# Patient Record
Sex: Female | Born: 2000
Health system: Southern US, Community
[De-identification: ages and names within clinical notes are randomized; demographics above are authoritative.]

## PROBLEM LIST (undated history)

## (undated) DIAGNOSIS — E119 Type 2 diabetes mellitus without complications: Secondary | ICD-10-CM

---
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
## 2019-01-02 ENCOUNTER — Ambulatory Visit (HOSPITAL_COMMUNITY)
Admission: RE | Admit: 2019-01-02 | Discharge: 2019-01-02 | Disposition: A | Discharge status: Home / Self Care | Payer: BC Managed Care – PPO | Source: Ambulatory Visit | Attending: Family Medicine | Admitting: Family Medicine

## 2019-01-02 ENCOUNTER — Other Ambulatory Visit: Payer: Self-pay

## 2019-01-02 ENCOUNTER — Other Ambulatory Visit (HOSPITAL_COMMUNITY): Payer: Self-pay | Admitting: Family Medicine

## 2019-01-02 DIAGNOSIS — N939 Abnormal uterine and vaginal bleeding, unspecified: Secondary | ICD-10-CM | POA: Diagnosis not present

## 2019-01-02 DIAGNOSIS — O039 Complete or unspecified spontaneous abortion without complication: Secondary | ICD-10-CM | POA: Diagnosis present

## 2019-01-27 ENCOUNTER — Other Ambulatory Visit: Payer: Self-pay

## 2019-01-27 ENCOUNTER — Emergency Department (HOSPITAL_COMMUNITY)
Admission: EM | Admit: 2019-01-27 | Discharge: 2019-01-27 | Disposition: A | Discharge status: Home / Self Care | Payer: BC Managed Care – PPO | Attending: Emergency Medicine | Admitting: Emergency Medicine

## 2019-01-27 ENCOUNTER — Encounter (HOSPITAL_COMMUNITY): Payer: Self-pay | Admitting: *Deleted

## 2019-01-27 DIAGNOSIS — Z7984 Long term (current) use of oral hypoglycemic drugs: Secondary | ICD-10-CM | POA: Diagnosis not present

## 2019-01-27 DIAGNOSIS — R102 Pelvic and perineal pain: Secondary | ICD-10-CM | POA: Diagnosis present

## 2019-01-27 DIAGNOSIS — E119 Type 2 diabetes mellitus without complications: Secondary | ICD-10-CM | POA: Insufficient documentation

## 2019-01-27 DIAGNOSIS — N939 Abnormal uterine and vaginal bleeding, unspecified: Secondary | ICD-10-CM | POA: Insufficient documentation

## 2019-01-27 DIAGNOSIS — Z79899 Other long term (current) drug therapy: Secondary | ICD-10-CM | POA: Insufficient documentation

## 2019-01-27 HISTORY — DX: Type 2 diabetes mellitus without complications: E11.9

## 2019-01-27 LAB — WET PREP, GENITAL
Clue Cells Wet Prep HPF POC: NONE SEEN
Sperm: NONE SEEN
Trich, Wet Prep: NONE SEEN
Yeast Wet Prep HPF POC: NONE SEEN

## 2019-01-27 LAB — URINALYSIS, ROUTINE W REFLEX MICROSCOPIC
Bilirubin Urine: NEGATIVE
Glucose, UA: 150 mg/dL — AB
Ketones, ur: NEGATIVE mg/dL
Leukocytes,Ua: NEGATIVE
Nitrite: NEGATIVE
Protein, ur: NEGATIVE mg/dL
Specific Gravity, Urine: 1.012 (ref 1.005–1.030)
pH: 6 (ref 5.0–8.0)

## 2019-01-27 LAB — I-STAT BETA HCG BLOOD, ED (MC, WL, AP ONLY): I-stat hCG, quantitative: <5 m[IU]/mL

## 2019-01-27 NOTE — Discharge Instructions (Addendum)
You were seen in the ER for irregular vaginal bleeding and spotting.  Pregnancy test is negative.  Urine analysis does not show signs of infection.  Pelvic exam does not show any anatomy abnormalities.  I suspect some of your irregular bleeding is from withdrawal bleeding from hormones normalizing after your recent miscarriage.  Monitor your bleeding quantity and duration.  Given your previous irregular bleeding and periods in the past, if this continues you may need formal work-up by OB/GYN.  Call them to make an appointment for further discussion if your vaginal spotting continues or does not improve.  Return to the ER for heavier hemorrhaging or clots, severe pelvic or lower abdominal pain, fevers, chills, lightheadedness, passing out, chest pain or shortness of breath

## 2019-01-27 NOTE — ED Provider Notes (Signed)
[facility] EMERGENCY DEPARTMENT Provider Note   CSN: [id number] Arrival date & time: 01/27/19  1520     History   Chief Complaint Chief Complaint  Patient presents with  . Vaginal Bleeding    HPI Linda Rowe is a 18 y.o. female with h/o DM on oral agents, recent spontaneous miscarriage on 12/22/2018 at approx 8-9 weeks presents for evaluation of vaginal spotting for the since 9/13.  Initially brown then bright red then pink streaks.  This stopped last night but came back this morning as pink tinged spotting. She is only needing to use panty liners. She has used 2 panty liners in the lat 24 hours.  No interventions. No modifying factors.  Associated with intermittent mild suprapubic cramping.  Occasional mild dysuria.  Today while in the shower she inserted 2 fingers into her vagina and felt a "firmness" that she had never felt before and thinks her cervix is low.  This was not painful.  She uses vaginal ring for birth control but states the last 2 weeks she has not had it.  She has been sexually active with her female boyfriend for the last 2 weeks without birth control. They are not planning a pregnancy.  She has h/o irregular periods and spotting between periods several years ago but not lately.  States last month she went to her PCP who did a urine pregnancy test and it was negative. They then did an ultrasound and confirmed pregnancy.  She took one urine home pregnancy test today that was negative. She would like a blood pregnancy test. No known bleeding or clotting disorders. No anticoagulants. No known endometriosis, fibroids, ovarian cysts in the past.  She wanted boyfriend to stay in room during encounter. She is not concerned about STDs. Denies vaginal discharge or lesions.       HPI  Past Medical History:  Diagnosis Date  . Diabetes mellitus without complication (HCC)     There are no active problems to display for this patient.   History reviewed. No pertinent surgical  history.   OB History   No obstetric history on file.      Home Medications    Prior to Admission medications   Medication Sig Start Date End Date Taking? Authorizing Provider  ELURYNG 0.12-0.015 MG/24HR vaginal ring Place 1 each vaginally every 28 (twenty-eight) days.  01/22/19  Yes [provider]  glimepiride (AMARYL) 2 MG tablet Take 2 mg by mouth daily with breakfast.  01/12/19  Yes [provider]  metFORMIN (GLUCOPHAGE) 500 MG tablet Take 500 mg by mouth 2 (two) times daily with a meal.  01/12/19  Yes [provider]    Family History No family history on file.  Social History Social History   Tobacco Use  . Smoking status: Never Smoker  . Smokeless tobacco: Never Used  Substance Use Topics  . Alcohol use: Never    Frequency: Never  . Drug use: Never     Allergies   Patient has no known allergies.   Review of Systems Review of Systems  Genitourinary: Positive for pelvic pain (cramping) and vaginal bleeding.  All other systems reviewed and are negative.    Physical Exam Updated Vital Signs BP 119/75 (BP Location: Right Arm)   Pulse 89   Temp 98.2 F (36.8 C) (Oral)   Resp 18   Ht 5\' 4"  (1.626 m)   Wt 98 kg   LMP 01/24/2019   SpO2 100%   BMI 37.09 kg/m  Physical Exam Vitals signs and nursing note reviewed.  Constitutional:      Appearance: She is well-developed.     Comments: Non toxic in NAD  HENT:     Head: Normocephalic and atraumatic.     Nose: Nose normal.  Eyes:     Conjunctiva/sclera: Conjunctivae normal.  Neck:     Musculoskeletal: Normal range of motion.  Cardiovascular:     Rate and Rhythm: Normal rate and regular rhythm.  Pulmonary:     Effort: Pulmonary effort is normal.     Breath sounds: Normal breath sounds.  Abdominal:     General: Bowel sounds are normal.     Palpations: Abdomen is soft.     Tenderness: There is no abdominal tenderness.     Comments: No G/R/R. No suprapubic or CVA tenderness.  Negative Murphy's and McBurney's. Active BS to lower quadrants. Obese.   Genitourinary:    Exam position: Lithotomy position.     Vagina: Bleeding present.     Comments:  Exam performed with EMT at bedside for assistance. External genitalia without lesions.  No groin lymphadenopathy.  Vaginal mucosa and cervix pink without lesions.  Scant dark blood in vaginal vault.  Cervix is closed and in normal anatomical position. No obvious cervical, bladder, rectal prolapse with cough test and bearing down.  No CMT.  Nonpalpable, nontender adnexa.  Perianal skin normal without lesions. Musculoskeletal: Normal range of motion.  Skin:    General: Skin is warm and dry.     Capillary Refill: Capillary refill takes less than 2 seconds.  Neurological:     Mental Status: She is alert.  Psychiatric:        Behavior: Behavior normal.      ED Treatments / Results  Labs (all labs ordered are listed, but only abnormal results are displayed) Labs Reviewed  WET PREP, GENITAL - Abnormal; Notable for the following components:      Result Value   WBC, Wet Prep HPF POC FEW (*)    All other components within normal limits  URINALYSIS, ROUTINE W REFLEX MICROSCOPIC - Abnormal; Notable for the following components:   APPearance HAZY (*)    Glucose, UA 150 (*)    Hgb urine dipstick LARGE (*)    Bacteria, UA RARE (*)    All other components within normal limits  URINE CULTURE  I-STAT BETA HCG BLOOD, ED ([facility], [facility], [facility] ONLY)  GC/CHLAMYDIA PROBE AMP () NOT AT [facility]    EKG None  Radiology No results found.  Procedures Procedures (including critical care time)  Medications Ordered in ED Medications - No data to display   Initial Impression / Assessment and Plan / ED Course  I have reviewed the triage vital signs and the nursing notes.  Pertinent labs & imaging results that were available during my care of the patient were reviewed by me and considered in my medical decision making (see chart  for details).  Clinical Course as of Jan 26 1909  Tue Jan 27, 2019  1805 Hgb urine dipstick(!): LARGE [CG]  1805 RBC / HPF: 0-5 [CG]  1805 WBC, UA: 0-5 [CG]  1805 Bacteria, UA(!): RARE [CG]  1805 Squamous Epithelial / LPF: 11-20 [CG]  1805 Mucus: PRESENT [CG]  1805 Ca Oxalate Crys, UA: PRESENT [CG]    Clinical Course User Index [CG] Kinnie Feil, PA-C    Highest on ddx is withdrawal spotting/vaginal bleeding from recent miscarriage.  No fever. Abd/pelvic exam benign.  Hcg negative. UA without  signs of infection.  She deferred empiric STD treatment today given low risk sexual practices and no symptoms, this is reasonable.  No signs of heavy hemorrhage.  Doubt ectopic pregnancy, retained products, infection of uterus.  I don't think there is indication for further emergent work up today.  Recommended monitoring of bleeding and f/u with GYN. Return precautions given. Pt comfortable with this.   Final Clinical Impressions(s) / ED Diagnoses   Final diagnoses:  Vaginal bleeding    ED Discharge Orders    None       Jerrell MylarGibbons, Osmin Welz J, PA-C 01/27/19 1910    Bethann BerkshireZammit, Joseph, MD 01/29/19 1220

## 2019-01-27 NOTE — ED Triage Notes (Signed)
Irregular vaginal bleeding, states she has had 2 episodes of bleeding this month and had a miscarriage 12/22/18.

## 2019-01-29 LAB — CERVICOVAGINAL ANCILLARY ONLY
Chlamydia: NEGATIVE
Neisseria Gonorrhea: NEGATIVE

## 2019-01-29 LAB — URINE CULTURE: Culture: <10000 — AB

## 2019-03-04 ENCOUNTER — Other Ambulatory Visit: Payer: Self-pay

## 2019-03-04 ENCOUNTER — Emergency Department (HOSPITAL_COMMUNITY)
Admission: EM | Admit: 2019-03-04 | Discharge: 2019-03-04 | Disposition: A | Discharge status: Home / Self Care | Payer: BC Managed Care – PPO | Attending: Emergency Medicine | Admitting: Emergency Medicine

## 2019-03-04 ENCOUNTER — Encounter (HOSPITAL_COMMUNITY): Payer: Self-pay

## 2019-03-04 DIAGNOSIS — S30810A Abrasion of lower back and pelvis, initial encounter: Secondary | ICD-10-CM | POA: Diagnosis not present

## 2019-03-04 DIAGNOSIS — S20411A Abrasion of right back wall of thorax, initial encounter: Secondary | ICD-10-CM | POA: Insufficient documentation

## 2019-03-04 DIAGNOSIS — Y9389 Activity, other specified: Secondary | ICD-10-CM | POA: Diagnosis not present

## 2019-03-04 DIAGNOSIS — Y92410 Unspecified street and highway as the place of occurrence of the external cause: Secondary | ICD-10-CM | POA: Insufficient documentation

## 2019-03-04 DIAGNOSIS — R52 Pain, unspecified: Secondary | ICD-10-CM | POA: Diagnosis not present

## 2019-03-04 DIAGNOSIS — Y999 Unspecified external cause status: Secondary | ICD-10-CM | POA: Diagnosis not present

## 2019-03-04 DIAGNOSIS — S20312A Abrasion of left front wall of thorax, initial encounter: Secondary | ICD-10-CM | POA: Diagnosis not present

## 2019-03-04 LAB — PREGNANCY, URINE: Preg Test, Ur: NEGATIVE

## 2019-03-04 MED ORDER — LIDOCAINE 5 % EX PTCH: 1.0000 | MEDICATED_PATCH | CUTANEOUS | 0 refills | Status: AC

## 2019-03-04 MED ORDER — METHOCARBAMOL 500 MG PO TABS: 500.0000 mg | ORAL_TABLET | Freq: Two times a day (BID) | ORAL | 0 refills | Status: AC
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
## 2019-03-04 NOTE — Discharge Instructions (Signed)
Expect your soreness to increase over the next 2-3 days. Take it easy, but do not lay around too much as this may make any stiffness worse.  Antiinflammatory medications: Take 600 mg of ibuprofen every 6 hours or 440 mg (over the counter dose) to 500 mg (prescription dose) of naproxen every 12 hours for the next 3 days. After this time, these medications may be used as needed for pain. Take these medications with food to avoid upset stomach. Choose only one of these medications, do not take them together. Acetaminophen (generic for Tylenol): Should you continue to have additional pain while taking the ibuprofen or naproxen, you may add in acetaminophen as needed. Your daily total maximum amount of acetaminophen from all sources should be limited to 4000mg /day for persons without liver problems, or 2000mg /day for those with liver problems. Methocarbamol: Methocarbamol (generic for Robaxin) is a muscle relaxer and can help relieve stiff muscles or muscle spasms.  Do not drive or perform other dangerous activities while taking this medication as it can cause drowsiness as well as changes in reaction time and judgement. Lidocaine patches: These are available via either prescription or over-the-counter. The over-the-counter option may be more economical one and are likely just as effective. There are multiple over-the-counter brands, such as Salonpas. Exercises: Be sure to perform the attached exercises starting with three times a week and working up to performing them daily. This is an essential part of preventing long term problems.  Follow up: Follow up with a primary care provider for any future management of these complaints. Be sure to follow up within 7-10 days. Return: Return to the ED should symptoms worsen.  For prescription assistance, may try using prescription discount sites or apps, such as goodrx.com   Wound Care - General Wound Cleaning: Clean the wound and surrounding area gently with tap  water and mild soap. Rinse well and blot dry. Do not scrub the wound, as this may cause the wound edges to come apart. You may shower, but avoid submerging the wound, such as with a bath or swimming.  Clean the wound daily to prevent infection.  Do not use cleaners such as hydrogen peroxide or alcohol.   Scar reduction: Application of a topical antibiotic ointment, such as Neosporin, after the wound has begun to close and heal well can decrease scab formation and reduce scarring. After the wound has healed, application of ointments such as Aquaphor can also reduce scar formation.  The key to scar reduction is keeping the skin well hydrated and supple. Drinking plenty of water throughout the day (At least eight 8oz glasses of water a day) is essential to staying well hydrated.  Sun exposure: Keep the wound out of the sun. After the wound has healed, continue to protect it from the sun by wearing protective clothing or applying sunscreen.  Return: Return to the ED should signs of infection arise, such as spreading redness, puffiness/swelling, pus draining from the wound, severe increase in pain, fever over 100.39F, or any other major issues.  For prescription assistance, may try using prescription discount sites or apps, such as goodrx.com
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
## 2019-03-06 ENCOUNTER — Encounter (HOSPITAL_COMMUNITY): Payer: Self-pay | Admitting: *Deleted

## 2019-03-06 ENCOUNTER — Other Ambulatory Visit: Payer: Self-pay

## 2019-03-06 ENCOUNTER — Emergency Department (HOSPITAL_COMMUNITY)
Admission: EM | Admit: 2019-03-06 | Discharge: 2019-03-06 | Disposition: A | Discharge status: Home / Self Care | Payer: BC Managed Care – PPO | Attending: Emergency Medicine | Admitting: Emergency Medicine

## 2019-03-06 ENCOUNTER — Emergency Department (HOSPITAL_COMMUNITY): Payer: BC Managed Care – PPO

## 2019-03-06 DIAGNOSIS — S3992XA Unspecified injury of lower back, initial encounter: Secondary | ICD-10-CM | POA: Diagnosis not present

## 2019-03-06 DIAGNOSIS — Z7984 Long term (current) use of oral hypoglycemic drugs: Secondary | ICD-10-CM | POA: Diagnosis not present

## 2019-03-06 DIAGNOSIS — E119 Type 2 diabetes mellitus without complications: Secondary | ICD-10-CM | POA: Insufficient documentation

## 2019-03-06 DIAGNOSIS — S161XXD Strain of muscle, fascia and tendon at neck level, subsequent encounter: Secondary | ICD-10-CM | POA: Diagnosis not present

## 2019-03-06 DIAGNOSIS — S199XXD Unspecified injury of neck, subsequent encounter: Secondary | ICD-10-CM | POA: Diagnosis present

## 2019-03-06 DIAGNOSIS — S199XXA Unspecified injury of neck, initial encounter: Secondary | ICD-10-CM | POA: Diagnosis not present

## 2019-03-06 DIAGNOSIS — S39012D Strain of muscle, fascia and tendon of lower back, subsequent encounter: Secondary | ICD-10-CM | POA: Diagnosis not present

## 2019-03-06 LAB — POC URINE PREG, ED: Preg Test, Ur: NEGATIVE

## 2019-03-06 MED ORDER — IBUPROFEN 800 MG PO TABS: 800.0000 mg | ORAL_TABLET | Freq: Three times a day (TID) | ORAL | 0 refills | Status: AC
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
## 2019-03-06 NOTE — Discharge Instructions (Signed)
Your xrays are negative tonight as discussed. Continue using your robaxin and the lidocaine patches as discussed.  Start taking ibuprofen which should also help with symptomatic relief.  You may use ice as much as possible, but can also add a heating pad to your areas of pain for 20 minutes 3 times daily starting tomorrow.

## 2019-03-13 DIAGNOSIS — S134XXD Sprain of ligaments of cervical spine, subsequent encounter: Secondary | ICD-10-CM | POA: Diagnosis not present

## 2019-03-13 DIAGNOSIS — M542 Cervicalgia: Secondary | ICD-10-CM | POA: Diagnosis not present

## 2019-03-13 DIAGNOSIS — Z68.41 Body mass index (BMI) pediatric, less than 5th percentile for age: Secondary | ICD-10-CM | POA: Diagnosis not present

## 2019-05-28 DIAGNOSIS — E7849 Other hyperlipidemia: Secondary | ICD-10-CM | POA: Diagnosis not present

## 2019-05-28 DIAGNOSIS — F329 Major depressive disorder, single episode, unspecified: Secondary | ICD-10-CM | POA: Diagnosis not present

## 2019-05-28 DIAGNOSIS — Z1389 Encounter for screening for other disorder: Secondary | ICD-10-CM | POA: Diagnosis not present

## 2019-05-28 DIAGNOSIS — E1165 Type 2 diabetes mellitus with hyperglycemia: Secondary | ICD-10-CM | POA: Diagnosis not present

## 2019-08-21 DIAGNOSIS — Z68.41 Body mass index (BMI) pediatric, less than 5th percentile for age: Secondary | ICD-10-CM | POA: Diagnosis not present

## 2019-08-21 DIAGNOSIS — E7849 Other hyperlipidemia: Secondary | ICD-10-CM | POA: Diagnosis not present

## 2019-08-21 DIAGNOSIS — E1165 Type 2 diabetes mellitus with hyperglycemia: Secondary | ICD-10-CM | POA: Diagnosis not present

## 2020-01-07 DIAGNOSIS — Z68.41 Body mass index (BMI) pediatric, less than 5th percentile for age: Secondary | ICD-10-CM | POA: Diagnosis not present

## 2020-01-07 DIAGNOSIS — E119 Type 2 diabetes mellitus without complications: Secondary | ICD-10-CM | POA: Diagnosis not present

## 2020-03-22 ENCOUNTER — Other Ambulatory Visit: Payer: Self-pay

## 2020-04-10 IMAGING — US TRANSVAGINAL ULTRASOUND OF PELVIS
1 series · 13 of 25 positions shown · non-contrast
Comparison: None

CLINICAL DATA: Spontaneous abortion 10 days ago, follow-up, no
current bleeding, question retained products of conception

EXAM:
ULTRASOUND PELVIS TRANSVAGINAL
TECHNIQUE: Transvaginal ultrasound examination of the pelvis was performed
including evaluation of the uterus, ovaries, adnexal regions, and
pelvic cul-de-sac.

[Series 1: transvaginal ultrasound of pelvis · 0.11mm/px · 13 of 99 slices shown]
[im 1/99]
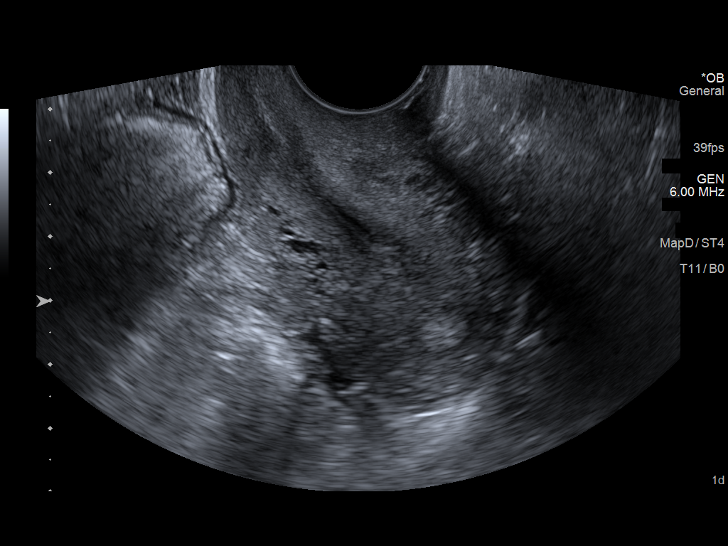
[im 9/99]
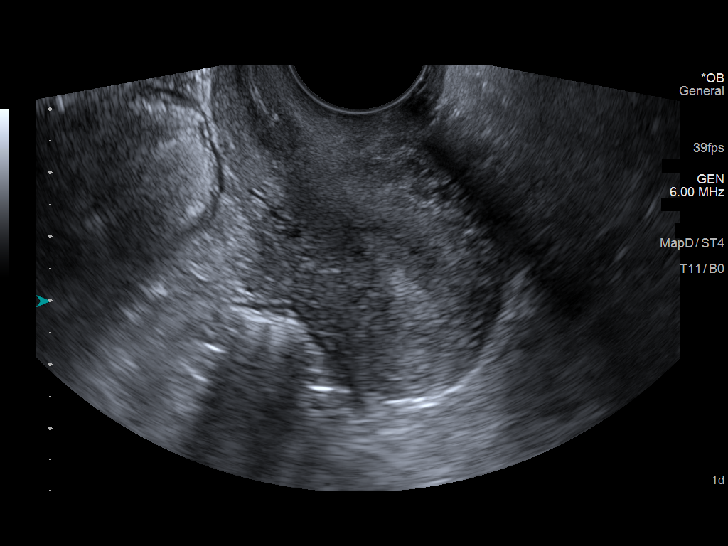
[im 17/99]
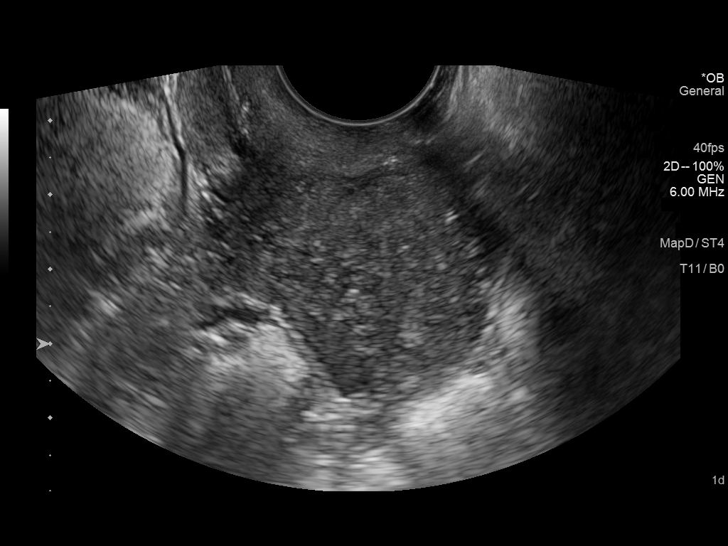
[im 25/99]
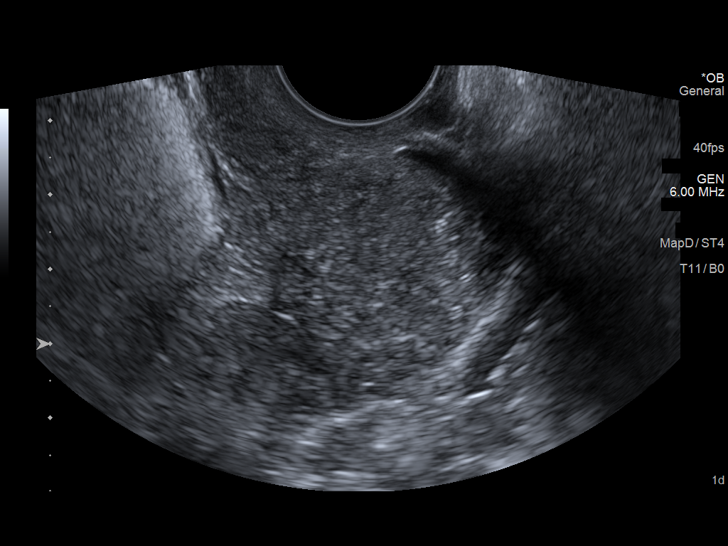
[im 33/99]
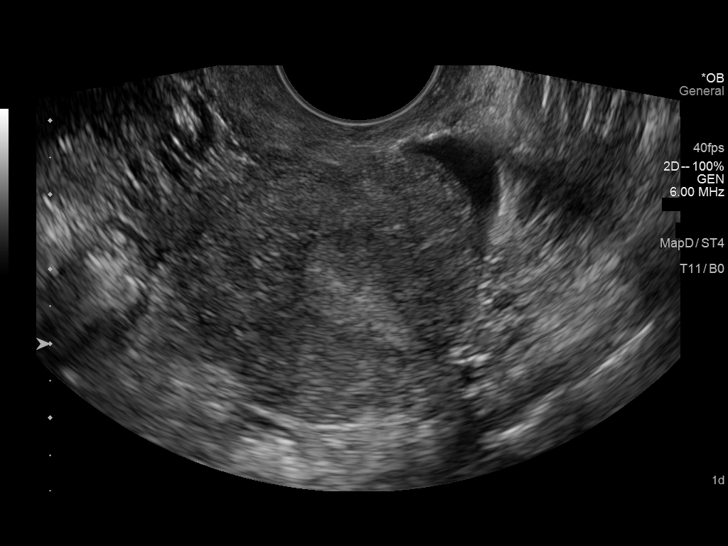
[im 41/99]
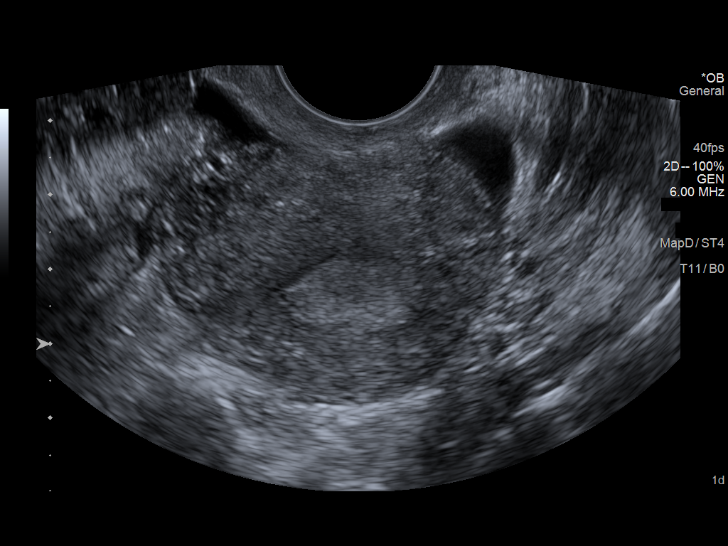
[im 50/99]
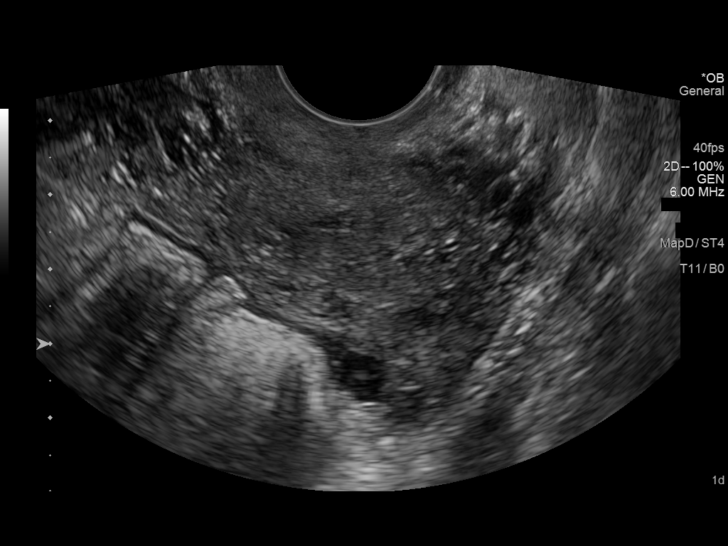
[im 58/99]
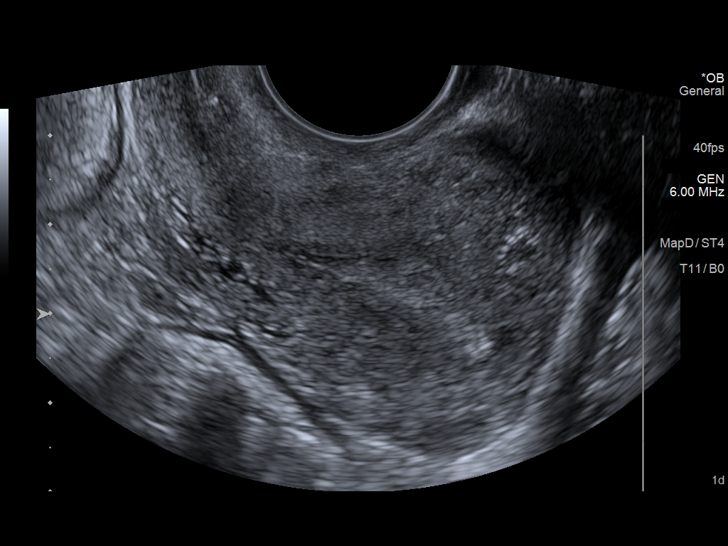
[im 66/99]
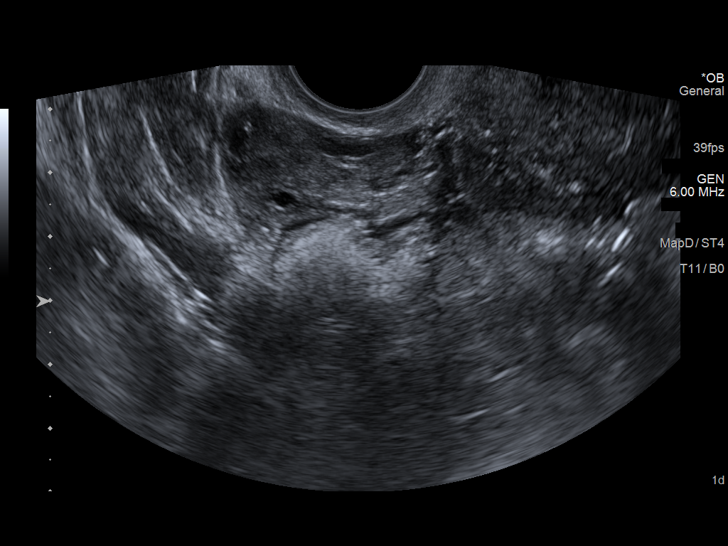
[im 74/99]
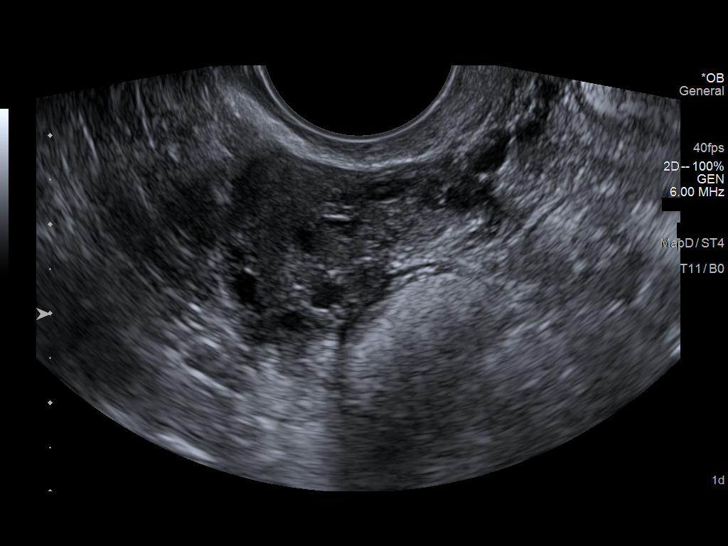
[im 82/99]
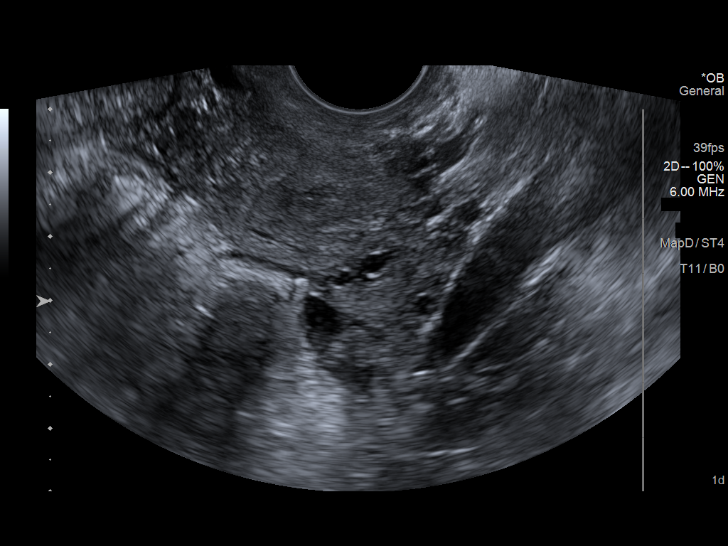
[im 90/99]
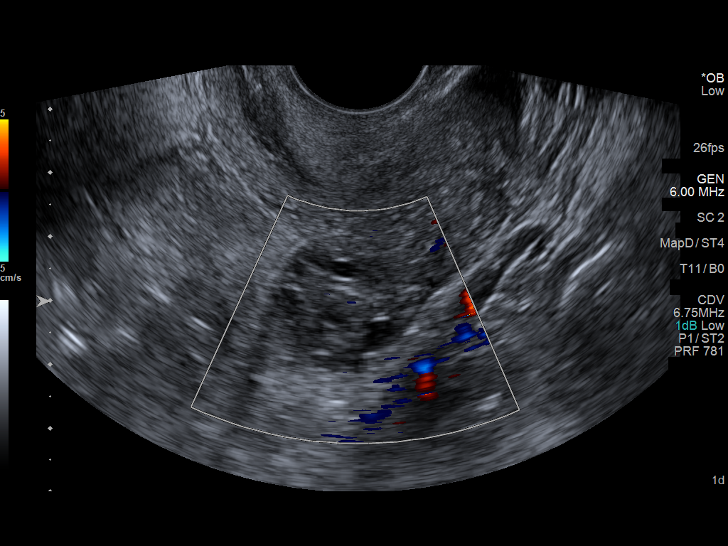
[im 99/99]
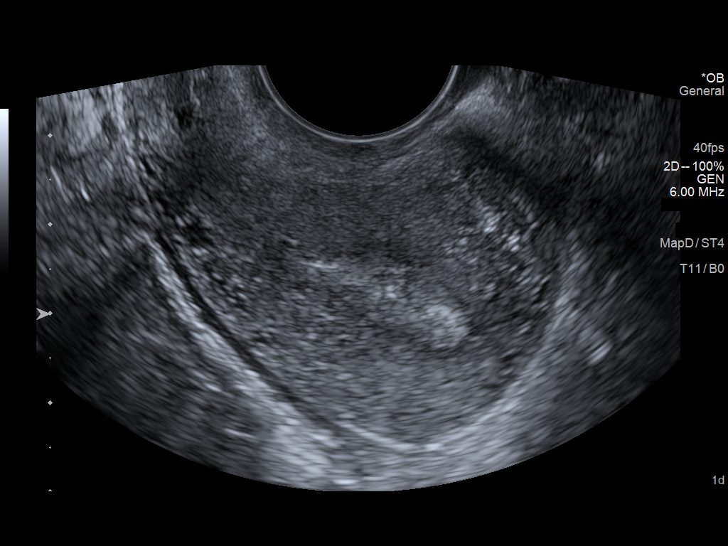

[13 of 25 positions shown; findings below may reference images not displayed]

FINDINGS: Uterus

Measurements: 6.2 x 3.1 x 4.0 cm = volume: 40 mL. Retroverted.
Normal morphology without mass

Endometrium

Thickness: 6 mm. Heterogeneous appearance, some more full at the
fundal portion, with evidence of some motion on cine analysis,
question minimal endometrial blood. No definite echogenic foci or
mass seen to suggest retained products of conception.

Right ovary

Measurements: 3.3 x 2.1 x 2.0 cm = volume: 7.1 mL. Normal morphology
without mass

Left ovary

Measurements: 3.1 x 1.6 x 2.2 cm = volume: 6.0 mL. Normal morphology
without mass

Other findings:  Small amount of free pelvic fluid.

No adnexal masses.
IMPRESSION: Suspect small amount of retained blood within endometrial canal at
the upper uterine segment.

No definite evidence of retained products of conception.

Otherwise unremarkable uterus and ovaries.

## 2020-05-05 ENCOUNTER — Encounter (HOSPITAL_COMMUNITY): Payer: Self-pay | Admitting: Emergency Medicine

## 2020-05-05 ENCOUNTER — Emergency Department (HOSPITAL_COMMUNITY): Payer: BC Managed Care – PPO

## 2020-05-05 ENCOUNTER — Emergency Department (HOSPITAL_COMMUNITY)
Admission: EM | Admit: 2020-05-05 | Discharge: 2020-05-05 | Disposition: A | Discharge status: Home / Self Care | Payer: BC Managed Care – PPO | Attending: Emergency Medicine | Admitting: Emergency Medicine

## 2020-05-05 ENCOUNTER — Other Ambulatory Visit: Payer: Self-pay

## 2020-05-05 DIAGNOSIS — R1031 Right lower quadrant pain: Secondary | ICD-10-CM | POA: Diagnosis not present

## 2020-05-05 DIAGNOSIS — K760 Fatty (change of) liver, not elsewhere classified: Secondary | ICD-10-CM | POA: Diagnosis not present

## 2020-05-05 DIAGNOSIS — N200 Calculus of kidney: Secondary | ICD-10-CM | POA: Diagnosis not present

## 2020-05-05 DIAGNOSIS — E1165 Type 2 diabetes mellitus with hyperglycemia: Secondary | ICD-10-CM | POA: Diagnosis not present

## 2020-05-05 DIAGNOSIS — N132 Hydronephrosis with renal and ureteral calculous obstruction: Secondary | ICD-10-CM | POA: Diagnosis not present

## 2020-05-05 DIAGNOSIS — Z7984 Long term (current) use of oral hypoglycemic drugs: Secondary | ICD-10-CM | POA: Diagnosis not present

## 2020-05-05 DIAGNOSIS — N201 Calculus of ureter: Secondary | ICD-10-CM | POA: Diagnosis not present

## 2020-05-05 DIAGNOSIS — R739 Hyperglycemia, unspecified: Secondary | ICD-10-CM

## 2020-05-05 DIAGNOSIS — E119 Type 2 diabetes mellitus without complications: Secondary | ICD-10-CM | POA: Diagnosis not present

## 2020-05-05 DIAGNOSIS — N23 Unspecified renal colic: Secondary | ICD-10-CM

## 2020-05-05 DIAGNOSIS — N138 Other obstructive and reflux uropathy: Secondary | ICD-10-CM | POA: Diagnosis not present

## 2020-05-05 DIAGNOSIS — R1111 Vomiting without nausea: Secondary | ICD-10-CM | POA: Diagnosis not present

## 2020-05-05 LAB — COMPREHENSIVE METABOLIC PANEL WITH GFR
ALT: 25 U/L (ref 0–44)
AST: 20 U/L (ref 15–41)
Albumin: 4.5 g/dL (ref 3.5–5.0)
Alkaline Phosphatase: 54 U/L (ref 38–126)
Anion gap: 14 (ref 5–15)
BUN: 9 mg/dL (ref 6–20)
CO2: 25 mmol/L (ref 22–32)
Calcium: 9.5 mg/dL (ref 8.9–10.3)
Chloride: 99 mmol/L (ref 98–111)
Creatinine, Ser: 0.62 mg/dL (ref 0.44–1.00)
GFR, Estimated: >60 mL/min
Glucose, Bld: 161 mg/dL — ABNORMAL HIGH (ref 70–99)
Potassium: 4.2 mmol/L (ref 3.5–5.1)
Sodium: 138 mmol/L (ref 135–145)
Total Bilirubin: 0.4 mg/dL (ref 0.3–1.2)
Total Protein: 8 g/dL (ref 6.5–8.1)

## 2020-05-05 LAB — CBC WITH DIFFERENTIAL/PLATELET
Abs Immature Granulocytes: 0.05 K/uL (ref 0.00–0.07)
Basophils Absolute: 0 K/uL (ref 0.0–0.1)
Basophils Relative: 0 %
Eosinophils Absolute: 0.1 K/uL (ref 0.0–0.5)
Eosinophils Relative: 1 %
HCT: 39.8 % (ref 36.0–46.0)
Hemoglobin: 13.3 g/dL (ref 12.0–15.0)
Immature Granulocytes: 1 %
Lymphocytes Relative: 21 %
Lymphs Abs: 2.4 K/uL (ref 0.7–4.0)
MCH: 29.2 pg (ref 26.0–34.0)
MCHC: 33.4 g/dL (ref 30.0–36.0)
MCV: 87.5 fL (ref 80.0–100.0)
Monocytes Absolute: 0.6 K/uL (ref 0.1–1.0)
Monocytes Relative: 6 %
Neutro Abs: 7.8 K/uL — ABNORMAL HIGH (ref 1.7–7.7)
Neutrophils Relative %: 71 %
Platelets: 288 K/uL (ref 150–400)
RBC: 4.55 MIL/uL (ref 3.87–5.11)
RDW: 12.1 % (ref 11.5–15.5)
WBC: 11 K/uL — ABNORMAL HIGH (ref 4.0–10.5)
nRBC: 0 % (ref 0.0–0.2)

## 2020-05-05 LAB — URINALYSIS, ROUTINE W REFLEX MICROSCOPIC
Bilirubin Urine: NEGATIVE
Glucose, UA: NEGATIVE mg/dL
Ketones, ur: NEGATIVE mg/dL
Leukocytes,Ua: NEGATIVE
Nitrite: NEGATIVE
Protein, ur: NEGATIVE mg/dL
RBC / HPF: >50 RBC/hpf — ABNORMAL HIGH (ref 0–5)
Specific Gravity, Urine: 1.006 (ref 1.005–1.030)
pH: 6 (ref 5.0–8.0)

## 2020-05-05 LAB — PREGNANCY, URINE: Preg Test, Ur: NEGATIVE

## 2020-05-05 LAB — LIPASE, BLOOD: Lipase: 20 U/L (ref 11–51)

## 2020-05-05 MED ORDER — TAMSULOSIN HCL 0.4 MG PO CAPS: 0.4000 mg | ORAL_CAPSULE | Freq: Two times a day (BID) | ORAL | 0 refills | Status: AC

## 2020-05-05 MED ORDER — ONDANSETRON 4 MG PO TBDP: 4.0000 mg | ORAL_TABLET | Freq: Three times a day (TID) | ORAL | 0 refills | Status: AC | PRN

## 2020-05-05 MED ORDER — OXYCODONE-ACETAMINOPHEN 5-325 MG PO TABS: 1.0000 | ORAL_TABLET | Freq: Four times a day (QID) | ORAL | 0 refills | Status: AC | PRN

## 2020-05-05 MED ORDER — KETOROLAC TROMETHAMINE 10 MG PO TABS: 10.0000 mg | ORAL_TABLET | Freq: Four times a day (QID) | ORAL | 0 refills | Status: AC | PRN

## 2020-05-05 NOTE — ED Triage Notes (Signed)
Pt c/o RLQ abdominal pain that began last night. Pt states she vomited once.

## 2020-05-05 NOTE — ED Provider Notes (Signed)
[facility] EMERGENCY DEPARTMENT Provider Note   CSN: [id number] Arrival date & time: 05/05/20  1036     History Chief Complaint  Patient presents with  . Abdominal Pain    Linda Rowe is a 19 y.o. female  Reports a fhx of kidney stones LMP 12/7 Sexually active with single partner Uses contraceptive  The history is provided by the patient and medical records. No language interpreter was used.  Abdominal Pain Pain location:  RLQ Pain quality: aching and sharp   Pain radiates to:  R flank Pain severity:  Severe Onset quality:  Sudden Duration:  10 hours Timing:  Intermittent Progression:  Resolved (colicky) Chronicity:  New Context: awakening from sleep   Context: not alcohol use, not diet changes, not eating, not laxative use, not medication withdrawal, not previous surgeries, not recent illness, not recent sexual activity, not recent travel, not retching, not sick contacts, not suspicious food intake and not trauma   Relieved by:  Nothing Worsened by:  Nothing Ineffective treatments:  None tried Associated symptoms: nausea and vomiting   Associated symptoms: no anorexia, no belching, no chest pain, no chills, no constipation, no cough, no diarrhea, no dysuria, no fatigue, no fever, no flatus, no hematemesis, no hematochezia, no hematuria, no melena, no shortness of breath, no sore throat, no vaginal bleeding and no vaginal discharge   Risk factors: obesity        Past Medical History:  Diagnosis Date  . Diabetes mellitus without complication (HCC)     There are no problems to display for this patient.   History reviewed. No pertinent surgical history.   OB History   No obstetric history on file.     History reviewed. No pertinent family history.  Social History   Tobacco Use  . Smoking status: Never Smoker  . Smokeless tobacco: Never Used  Vaping Use  . Vaping Use: Never used  Substance Use Topics  . Alcohol use: Never  . Drug use: Never     Home Medications Prior to Admission medications   Medication Sig Start Date End Date Taking? Authorizing Provider  ELURYNG 0.12-0.015 MG/24HR vaginal ring Place 1 each vaginally every 28 (twenty-eight) days.  01/22/19   [provider]  glimepiride (AMARYL) 2 MG tablet Take 2 mg by mouth daily with breakfast.  01/12/19   [provider]  ibuprofen (ADVIL) 800 MG tablet Take 1 tablet (800 mg total) by mouth 3 (three) times daily. 03/06/19   Burgess Amor, PA-C  lidocaine (LIDODERM) 5 % Place 1 patch onto the skin daily. Remove & Discard patch within 12 hours or as directed by MD 03/04/19   Harolyn Rutherford C, PA-C  metFORMIN (GLUCOPHAGE) 500 MG tablet Take 500 mg by mouth 2 (two) times daily with a meal.  01/12/19   [provider]  methocarbamol (ROBAXIN) 500 MG tablet Take 1 tablet (500 mg total) by mouth 2 (two) times daily. 03/04/19   Joy, Hillard Danker, PA-C    Allergies    Patient has no known allergies.  Review of Systems   Review of Systems  Constitutional: Negative for chills, fatigue and fever.  HENT: Negative for sore throat.   Respiratory: Negative for cough and shortness of breath.   Cardiovascular: Negative for chest pain.  Gastrointestinal: Positive for abdominal pain, nausea and vomiting. Negative for anorexia, constipation, diarrhea, flatus, hematemesis, hematochezia and melena.  Genitourinary: Negative for dysuria, hematuria, vaginal bleeding and vaginal discharge.    Physical Exam Updated Vital Signs BP  138/82   Pulse (!) 102   Temp 99.1 F (37.3 C) (Oral)   Resp 19   Ht 5\' 4"  (1.626 m)   Wt 99.8 kg   SpO2 100%   BMI 37.76 kg/m   Physical Exam Vitals and nursing note reviewed.  Constitutional:      General: She is not in acute distress.    Appearance: She is well-developed and well-nourished. She is not diaphoretic.  HENT:     Head: Normocephalic and atraumatic.  Eyes:     General: No scleral icterus.    Conjunctiva/sclera: Conjunctivae  normal.  Cardiovascular:     Rate and Rhythm: Normal rate and regular rhythm.     Heart sounds: Normal heart sounds. No murmur heard. No friction rub. No gallop.   Pulmonary:     Effort: Pulmonary effort is normal. No respiratory distress.     Breath sounds: Normal breath sounds.  Abdominal:     General: Bowel sounds are normal. There is no distension.     Palpations: Abdomen is soft. There is no mass.     Tenderness: There is no abdominal tenderness. There is no right CVA tenderness, left CVA tenderness or guarding.     Hernia: No hernia is present.  Musculoskeletal:     Cervical back: Normal range of motion.  Skin:    General: Skin is warm and dry.  Neurological:     Mental Status: She is alert and oriented to person, place, and time.  Psychiatric:        Behavior: Behavior normal.     ED Results / Procedures / Treatments   Labs (all labs ordered are listed, but only abnormal results are displayed) Labs Reviewed  URINALYSIS, ROUTINE W REFLEX MICROSCOPIC  PREGNANCY, URINE  CBC WITH DIFFERENTIAL/PLATELET  COMPREHENSIVE METABOLIC PANEL  LIPASE, BLOOD  I-STAT BETA HCG BLOOD, ED ([facility], [facility], [facility] ONLY)    EKG None  Radiology No results found.  Procedures Procedures (including critical care time)  Medications Ordered in ED Medications - No data to display  ED Course  I have reviewed the triage vital signs and the nursing notes.  Pertinent labs & imaging results that were available during my care of the patient were reviewed by me and considered in my medical decision making (see chart for details).    MDM Rules/Calculators/A&P                          .CC: Flank pain VS: BP 105/76   Pulse 88   Temp 99.1 F (37.3 C) (Oral)   Resp 17   Ht 5\' 4"  (1.626 m)   Wt 99.8 kg   SpO2 95%   BMI 37.76 kg/m   is gathered by patient and EMR. Previous records obtained and reviewed. DDX:The patient's complaint of flank pain involves an extensive number of  diagnostic and treatment options, and is a complaint that carries with it a high risk of complications, morbidity, and potential mortality. Given the large differential diagnosis, medical decision making is of high complexity. The differential diagnosis of emergent flank pain includes, but is not limited to :Abdominal aortic aneurysm,, Renal artery embolism,Renal vein thrombosis, Aortic dissection, Mesenteric ischemia, Pyelonephritis, Renal infarction, Renal hemorrhage, Nephrolithiasis/ Renal Colic, Bladder tumor,Cystitis, Biliary colic, Pancreatitis Perforated peptic ulcer Appendicitis ,Inguinal Hernia, Diverticulitis, Bowel obstruction Ectopic Pregnancy,PID/TOA,Ovarian cyst, Ovarian torsion Shingles Lower lobe pneumonia, Retroperitoneal hematoma/abscess/tumor, Epidural abscess, Epidural hematoma  Labs: I ordered reviewed and interpreted labs which include  CBC which shows elevated white blood cell count, CMP shows elevated glucose level.  These are likely acute phase reaction. Lipase within normal limits, urinalysis shows large amount of blood, rare bacteria and calcium oxalate Imaging: I ordered and reviewed images which included CT renal stone study. I independently visualized and interpreted all imaging. Significant findings include multiple bilateral kidney stones and a proximal 3 mm nonobstructing stone at the right UPJ.. EKG: Consults: MDM: Patient here with right lower quadrant pain radiating to the flank.  Given the description of her pain coming and going, resolving, colicky, severe, unable to find a comfortable position with vomiting and strong family history of kidney stones with a benign abdominal exam my suspicion was high for ureteral colic.  Work-up shows that she does have a UPJ stone I think this is the cause of the pain in her right lower quadrant I do not think that she has ovarian torsion or need for pelvic examination with obvious cause of symptoms.  Patient has normal renal  function.  She does have some fatty infiltration on CT scan of the liver which I discussed with her.  I reviewed PDMP will give her pain medications that she will likely have return of symptoms, nausea medication, Flomax, Toradol, urine strainer, outpatient follow-up with urology and PCP and I have discussed return precautions with the patient.  She appears otherwise appropriate for discharge at this time Patient disposition:The patient appears reasonably screened and/or stabilized for discharge and I doubt any other medical condition or other [facility] requiring further screening, evaluation, or treatment in the ED at this time prior to discharge. I have discussed lab and/or imaging findings with the patient and answered all questions/concerns to the best of my ability.I have discussed return precautions and OP follow up.    Final Clinical Impression(s) / ED Diagnoses Final diagnoses:  None    Rx / DC Orders ED Discharge Orders    None       Arthor Captain, PA-C 05/05/20 1454    Glynn Octave, MD 05/05/20 1658

## 2020-05-05 NOTE — Discharge Instructions (Signed)
Return to the ED immediately if you  Contact a health care provider if: You have a fever or chills. Your urine smells bad  You have pain or burning when you pass urine. Get help right away if: Your flank pain or groin pain suddenly worsens. You become confused or disoriented or you lose consciousness.  Your pain or vomiting is uncontrolled.

## 2020-05-05 NOTE — ED Notes (Signed)
Patient denies pain and is resting comfortably.  

## 2020-05-10 LAB — I-STAT BETA HCG BLOOD, ED (MC, WL, AP ONLY): I-stat hCG, quantitative: <5 m[IU]/mL

## 2020-12-01 DIAGNOSIS — E782 Mixed hyperlipidemia: Secondary | ICD-10-CM | POA: Diagnosis not present

## 2020-12-01 DIAGNOSIS — Z6836 Body mass index (BMI) 36.0-36.9, adult: Secondary | ICD-10-CM | POA: Diagnosis not present

## 2020-12-01 DIAGNOSIS — I10 Essential (primary) hypertension: Secondary | ICD-10-CM | POA: Diagnosis not present

## 2020-12-01 DIAGNOSIS — E118 Type 2 diabetes mellitus with unspecified complications: Secondary | ICD-10-CM | POA: Diagnosis not present

## 2020-12-01 DIAGNOSIS — E6609 Other obesity due to excess calories: Secondary | ICD-10-CM | POA: Diagnosis not present

## 2020-12-01 DIAGNOSIS — Z1389 Encounter for screening for other disorder: Secondary | ICD-10-CM | POA: Diagnosis not present

## 2020-12-01 DIAGNOSIS — E7849 Other hyperlipidemia: Secondary | ICD-10-CM | POA: Diagnosis not present

## 2020-12-01 DIAGNOSIS — Z1331 Encounter for screening for depression: Secondary | ICD-10-CM | POA: Diagnosis not present

## 2021-08-12 IMAGING — CT CT RENAL STONE PROTOCOL
2 of 4 series · 17 of 46 positions shown, 19 images · non-contrast
Comparison: 01/02/2019 pelvic ultrasound.

CLINICAL DATA: Right-sided abdominal pain with nausea and vomiting.

EXAM:
CT ABDOMEN AND PELVIS WITHOUT CONTRAST
TECHNIQUE: Multidetector CT imaging of the abdomen and pelvis was performed
following the standard protocol without IV contrast.

[Series 2: axial st · axial · 0.86mm/px · z∈[-636,-181]mm · 14 of 103 slices shown, 16 images]
[im 6/103  soft-tissue]
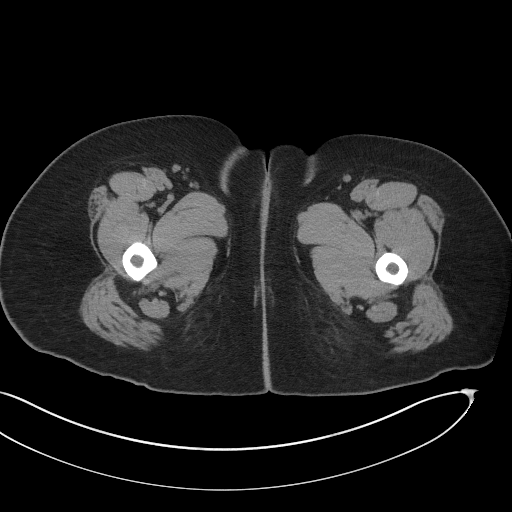
[im 6/103  bone]
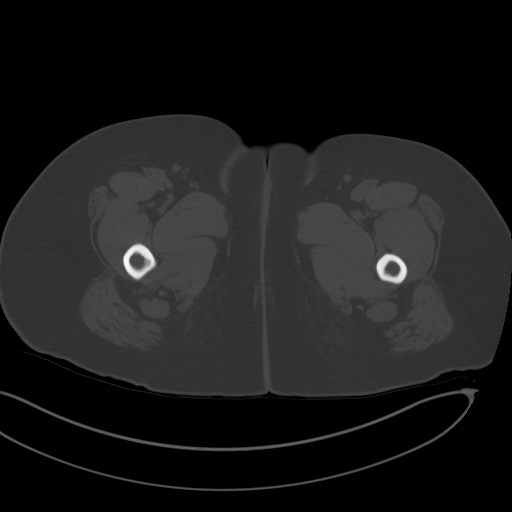
[im 12/103  soft-tissue]
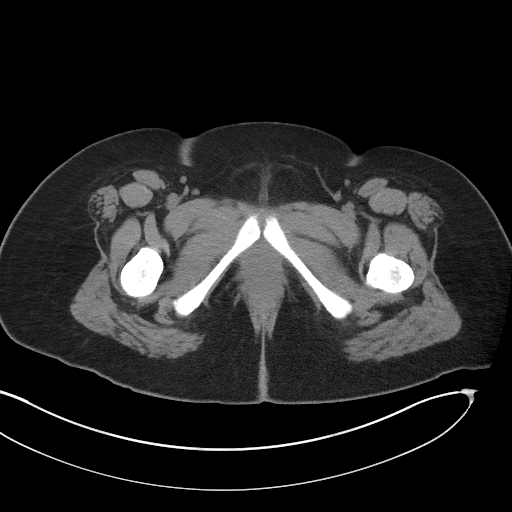
[im 23/103  soft-tissue]
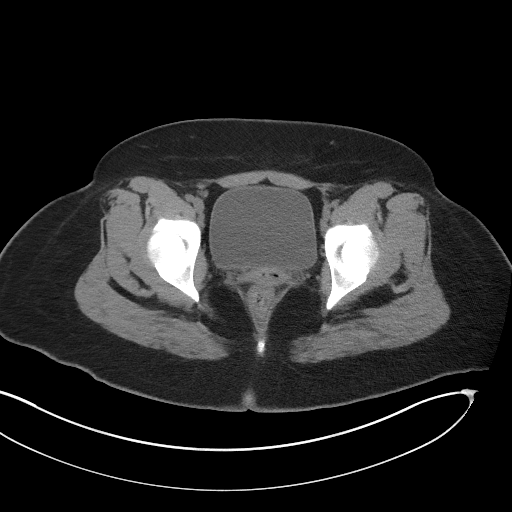
[im 29/103  soft-tissue]
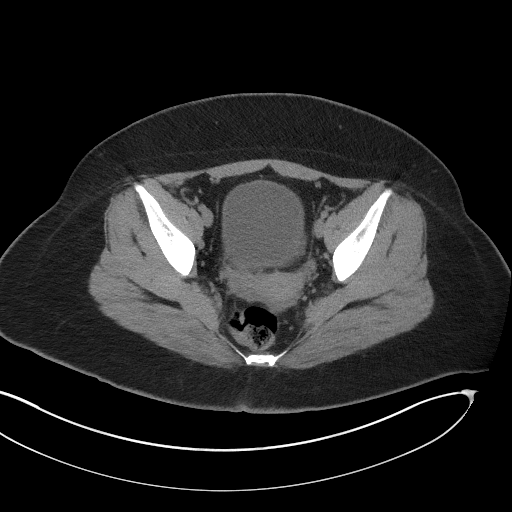
[im 35/103  soft-tissue]
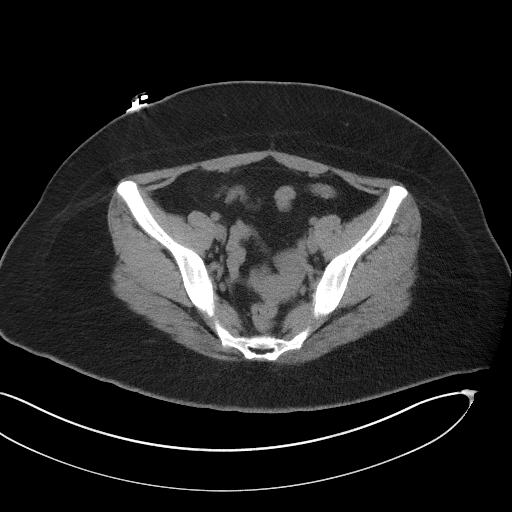
[im 40/103  soft-tissue]
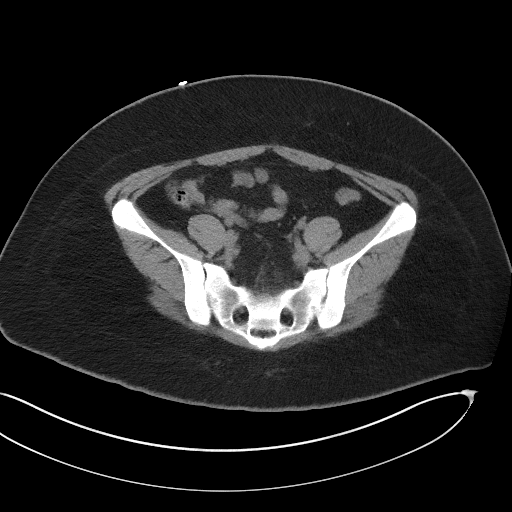
[im 46/103  soft-tissue]
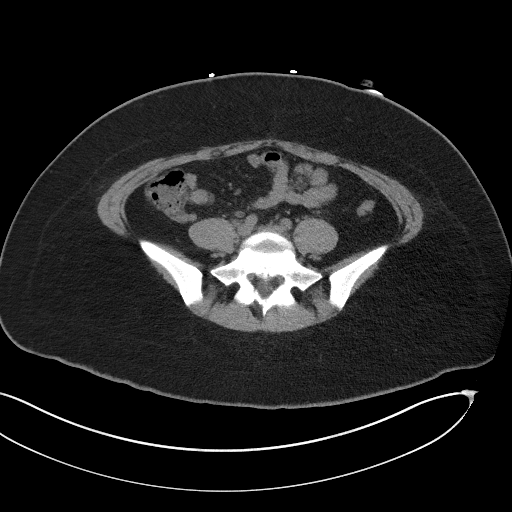
[im 57/103  soft-tissue]
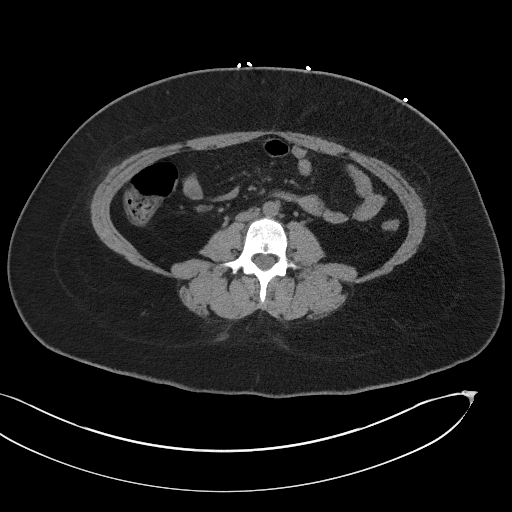
[im 63/103  soft-tissue]
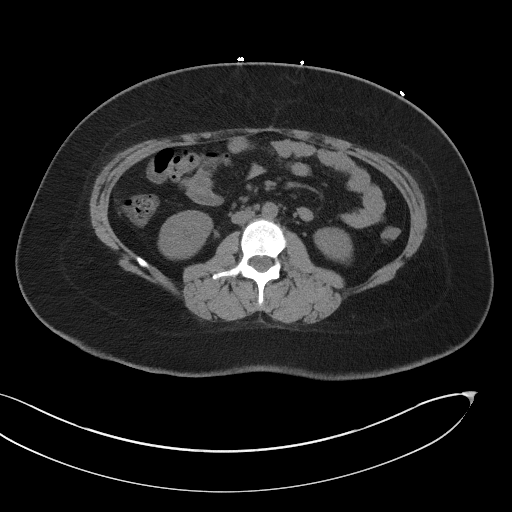
[im 63/103  bone]
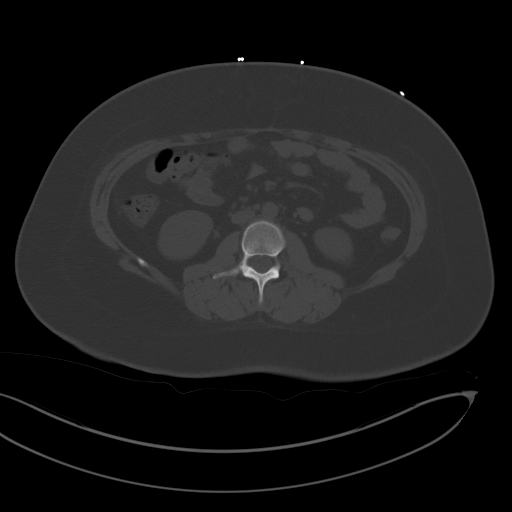
[im 69/103  soft-tissue]
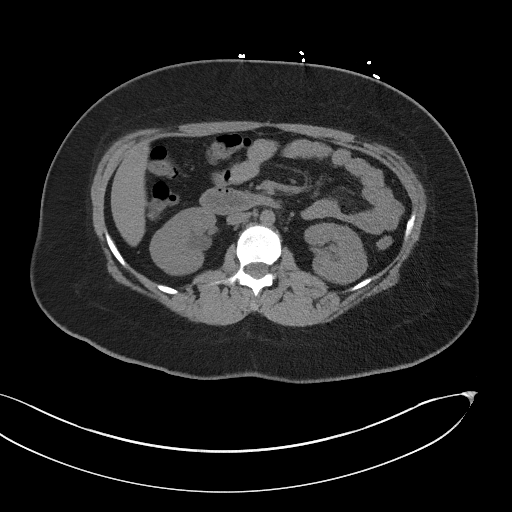
[im 74/103  soft-tissue]
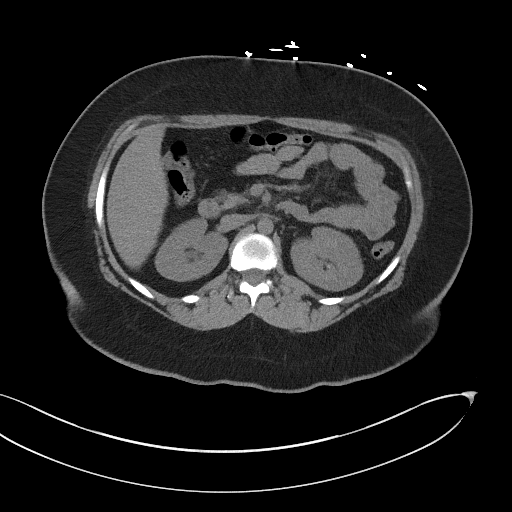
[im 80/103  soft-tissue]
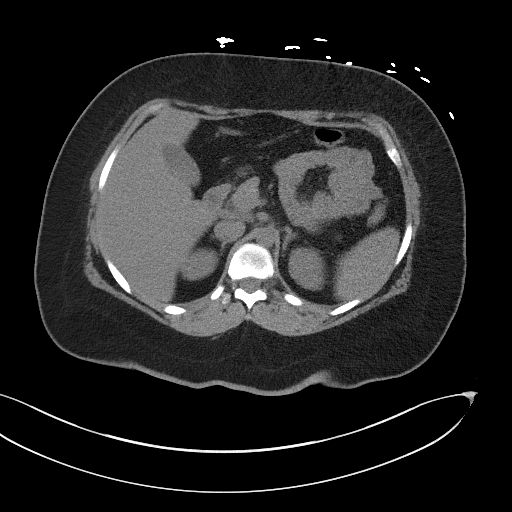
[im 91/103  soft-tissue]
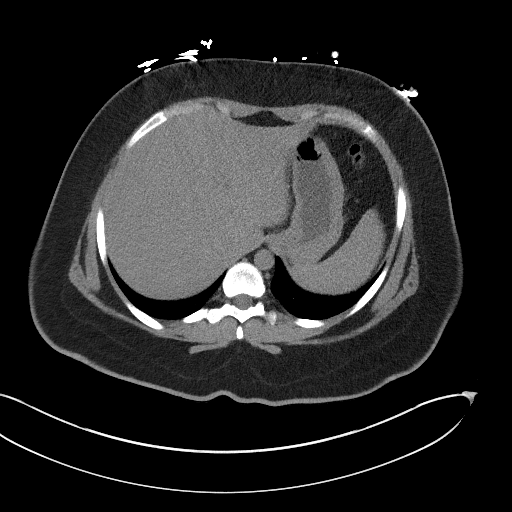
[im 97/103  soft-tissue]
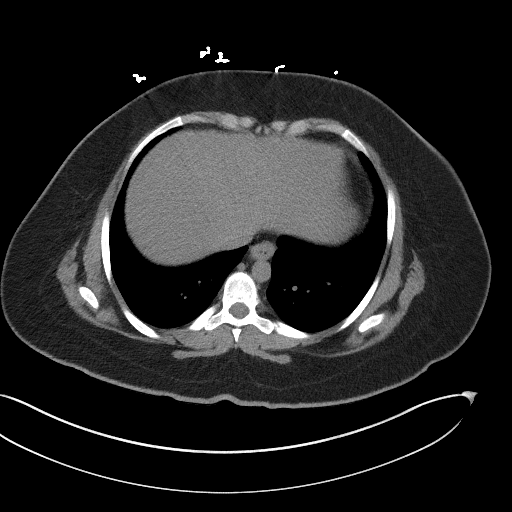

[Series 5: coronal st · coronal · 0.89mm/px · 3 of 106 slices shown]
[im 36/106  soft-tissue]
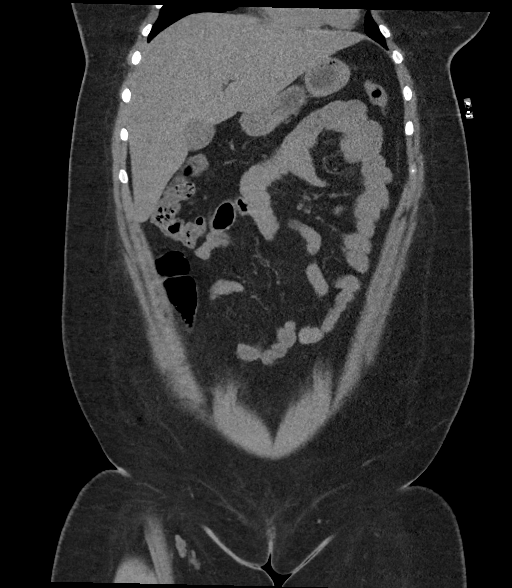
[im 47/106  soft-tissue]
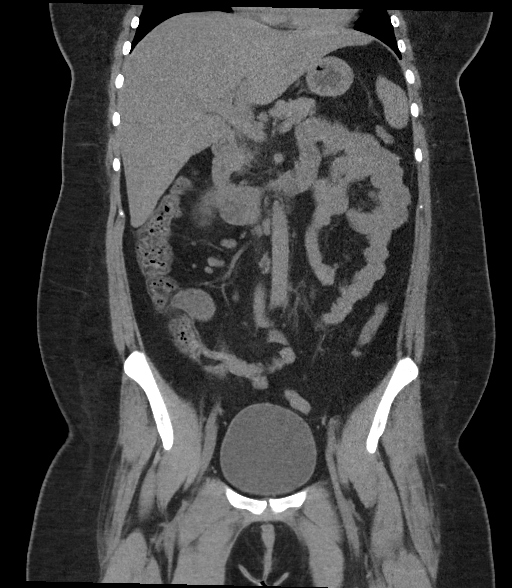
[im 59/106  soft-tissue]
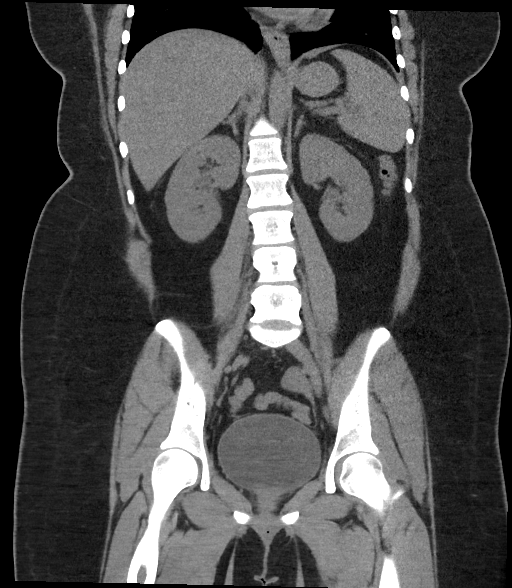

[17 of 46 positions shown; findings below may reference images not displayed]

FINDINGS: Lower chest: Clear lung bases. Normal heart size without pericardial
or pleural effusion.

Hepatobiliary: Moderate hepatic steatosis. Mild hepatomegaly at
cm craniocaudal. Normal gallbladder, without biliary ductal
dilatation.

Pancreas: Normal, without mass or ductal dilatation.

Spleen: Normal in size, without focal abnormality.

Adrenals/Urinary Tract: Normal adrenal glands. Bilateral punctate
renal collecting system calculi. No bladder calculi. Mild
right-sided hydronephrosis secondary to a 3 mm proximal right
ureteric stone, including on 57/5 and 39/2.

Stomach/Bowel: Normal stomach, without wall thickening. Normal
colon, appendix, and terminal ileum. Normal small bowel.

Vascular/Lymphatic: Normal caliber of the aorta and branch vessels.
No abdominopelvic adenopathy.

Reproductive: Normal uterus and adnexa.

Other: Trace free pelvic fluid is likely physiologic.

Musculoskeletal: No acute osseous abnormality.
IMPRESSION: 1. Mild right-sided urinary tract obstruction secondary to a
proximal right ureteric 3 mm stone.
2. Bilateral nephrolithiasis.
3. Hepatic steatosis and hepatomegaly.

## 2022-02-22 DIAGNOSIS — Z6836 Body mass index (BMI) 36.0-36.9, adult: Secondary | ICD-10-CM | POA: Diagnosis not present

## 2022-02-22 DIAGNOSIS — E6609 Other obesity due to excess calories: Secondary | ICD-10-CM | POA: Diagnosis not present

## 2022-02-22 DIAGNOSIS — E118 Type 2 diabetes mellitus with unspecified complications: Secondary | ICD-10-CM | POA: Diagnosis not present

## 2022-02-22 DIAGNOSIS — I10 Essential (primary) hypertension: Secondary | ICD-10-CM | POA: Diagnosis not present

## 2022-02-22 DIAGNOSIS — Z0001 Encounter for general adult medical examination with abnormal findings: Secondary | ICD-10-CM | POA: Diagnosis not present

## 2022-02-22 DIAGNOSIS — E782 Mixed hyperlipidemia: Secondary | ICD-10-CM | POA: Diagnosis not present

## 2022-02-22 DIAGNOSIS — Z1331 Encounter for screening for depression: Secondary | ICD-10-CM | POA: Diagnosis not present

## 2022-02-28 DIAGNOSIS — Z113 Encounter for screening for infections with a predominantly sexual mode of transmission: Secondary | ICD-10-CM | POA: Diagnosis not present

## 2022-02-28 DIAGNOSIS — E118 Type 2 diabetes mellitus with unspecified complications: Secondary | ICD-10-CM | POA: Diagnosis not present

## 2022-02-28 DIAGNOSIS — Z0001 Encounter for general adult medical examination with abnormal findings: Secondary | ICD-10-CM | POA: Diagnosis not present

## 2022-05-04 DIAGNOSIS — E118 Type 2 diabetes mellitus with unspecified complications: Secondary | ICD-10-CM | POA: Diagnosis not present

## 2022-05-04 DIAGNOSIS — Z6834 Body mass index (BMI) 34.0-34.9, adult: Secondary | ICD-10-CM | POA: Diagnosis not present

## 2022-05-04 DIAGNOSIS — E6609 Other obesity due to excess calories: Secondary | ICD-10-CM | POA: Diagnosis not present

## 2022-05-04 DIAGNOSIS — E1165 Type 2 diabetes mellitus with hyperglycemia: Secondary | ICD-10-CM | POA: Diagnosis not present

## 2022-05-04 DIAGNOSIS — B3731 Acute candidiasis of vulva and vagina: Secondary | ICD-10-CM | POA: Diagnosis not present

## 2022-05-04 DIAGNOSIS — E782 Mixed hyperlipidemia: Secondary | ICD-10-CM | POA: Diagnosis not present

## 2022-05-04 DIAGNOSIS — I10 Essential (primary) hypertension: Secondary | ICD-10-CM | POA: Diagnosis not present

## 2022-05-04 DIAGNOSIS — E7849 Other hyperlipidemia: Secondary | ICD-10-CM | POA: Diagnosis not present

## 2022-07-27 DIAGNOSIS — B3731 Acute candidiasis of vulva and vagina: Secondary | ICD-10-CM | POA: Diagnosis not present

## 2022-07-27 DIAGNOSIS — I10 Essential (primary) hypertension: Secondary | ICD-10-CM | POA: Diagnosis not present

## 2022-07-27 DIAGNOSIS — E1165 Type 2 diabetes mellitus with hyperglycemia: Secondary | ICD-10-CM | POA: Diagnosis not present

## 2022-07-27 DIAGNOSIS — E782 Mixed hyperlipidemia: Secondary | ICD-10-CM | POA: Diagnosis not present

## 2022-07-27 DIAGNOSIS — E6609 Other obesity due to excess calories: Secondary | ICD-10-CM | POA: Diagnosis not present

## 2022-07-27 DIAGNOSIS — Z6834 Body mass index (BMI) 34.0-34.9, adult: Secondary | ICD-10-CM | POA: Diagnosis not present

## 2022-09-11 DIAGNOSIS — Z01419 Encounter for gynecological examination (general) (routine) without abnormal findings: Secondary | ICD-10-CM | POA: Diagnosis not present

## 2023-01-30 DIAGNOSIS — E1100 Type 2 diabetes mellitus with hyperosmolarity without nonketotic hyperglycemic-hyperosmolar coma (NKHHC): Secondary | ICD-10-CM | POA: Diagnosis not present

## 2023-01-30 DIAGNOSIS — I10 Essential (primary) hypertension: Secondary | ICD-10-CM | POA: Diagnosis not present

## 2023-01-30 DIAGNOSIS — E1159 Type 2 diabetes mellitus with other circulatory complications: Secondary | ICD-10-CM | POA: Diagnosis not present

## 2023-01-30 DIAGNOSIS — E6609 Other obesity due to excess calories: Secondary | ICD-10-CM | POA: Diagnosis not present

## 2023-01-30 DIAGNOSIS — Z6834 Body mass index (BMI) 34.0-34.9, adult: Secondary | ICD-10-CM | POA: Diagnosis not present

## 2023-01-30 DIAGNOSIS — E782 Mixed hyperlipidemia: Secondary | ICD-10-CM | POA: Diagnosis not present

## 2023-03-12 DIAGNOSIS — L818 Other specified disorders of pigmentation: Secondary | ICD-10-CM | POA: Diagnosis not present

## 2023-03-12 DIAGNOSIS — L258 Unspecified contact dermatitis due to other agents: Secondary | ICD-10-CM | POA: Diagnosis not present

## 2023-03-26 DIAGNOSIS — L921 Necrobiosis lipoidica, not elsewhere classified: Secondary | ICD-10-CM | POA: Diagnosis not present

## 2023-08-05 DIAGNOSIS — E1159 Type 2 diabetes mellitus with other circulatory complications: Secondary | ICD-10-CM | POA: Diagnosis not present

## 2023-08-05 DIAGNOSIS — Z6834 Body mass index (BMI) 34.0-34.9, adult: Secondary | ICD-10-CM | POA: Diagnosis not present

## 2023-08-05 DIAGNOSIS — E1165 Type 2 diabetes mellitus with hyperglycemia: Secondary | ICD-10-CM | POA: Diagnosis not present

## 2023-08-05 DIAGNOSIS — E6609 Other obesity due to excess calories: Secondary | ICD-10-CM | POA: Diagnosis not present

## 2023-11-12 DIAGNOSIS — Z0001 Encounter for general adult medical examination with abnormal findings: Secondary | ICD-10-CM | POA: Diagnosis not present

## 2023-11-12 DIAGNOSIS — Z6832 Body mass index (BMI) 32.0-32.9, adult: Secondary | ICD-10-CM | POA: Diagnosis not present

## 2023-11-12 DIAGNOSIS — I10 Essential (primary) hypertension: Secondary | ICD-10-CM | POA: Diagnosis not present

## 2023-11-12 DIAGNOSIS — E1165 Type 2 diabetes mellitus with hyperglycemia: Secondary | ICD-10-CM | POA: Diagnosis not present

## 2023-11-12 DIAGNOSIS — E6609 Other obesity due to excess calories: Secondary | ICD-10-CM | POA: Diagnosis not present

## 2023-11-12 DIAGNOSIS — E782 Mixed hyperlipidemia: Secondary | ICD-10-CM | POA: Diagnosis not present

## 2023-11-12 DIAGNOSIS — E7849 Other hyperlipidemia: Secondary | ICD-10-CM | POA: Diagnosis not present

## 2024-04-08 DIAGNOSIS — E7849 Other hyperlipidemia: Secondary | ICD-10-CM | POA: Diagnosis not present

## 2024-04-08 DIAGNOSIS — E1165 Type 2 diabetes mellitus with hyperglycemia: Secondary | ICD-10-CM | POA: Diagnosis not present
